# Patient Record
Sex: Male | Born: 1960 | Race: White | Hispanic: No | Marital: Married | Smoking: Current every day smoker
Health system: Southern US, Community
[De-identification: ages and names within clinical notes are randomized; demographics above are authoritative.]

## PROBLEM LIST (undated history)

## (undated) ENCOUNTER — Emergency Department (HOSPITAL_COMMUNITY): Discharge status: Absent without leave | Payer: Self-pay

---

## 2008-06-18 ENCOUNTER — Ambulatory Visit: Payer: Self-pay | Admitting: Diagnostic Radiology

## 2008-06-18 ENCOUNTER — Emergency Department (HOSPITAL_BASED_OUTPATIENT_CLINIC_OR_DEPARTMENT_OTHER): Admission: EM | Admit: 2008-06-18 | Discharge: 2008-06-18 | Payer: Self-pay | Admitting: Emergency Medicine

## 2010-03-16 ENCOUNTER — Ambulatory Visit: Payer: Self-pay | Admitting: Family Medicine

## 2011-02-24 ENCOUNTER — Encounter: Payer: Self-pay | Admitting: Family Medicine

## 2011-02-24 ENCOUNTER — Inpatient Hospital Stay (INDEPENDENT_AMBULATORY_CARE_PROVIDER_SITE_OTHER)
Admission: RE | Admit: 2011-02-24 | Discharge: 2011-02-24 | Disposition: A | Discharge status: Home / Self Care | Payer: MEDICARE | Source: Ambulatory Visit | Attending: Family Medicine | Admitting: Family Medicine

## 2011-02-24 DIAGNOSIS — J209 Acute bronchitis, unspecified: Secondary | ICD-10-CM

## 2011-02-24 DIAGNOSIS — J45909 Unspecified asthma, uncomplicated: Secondary | ICD-10-CM | POA: Insufficient documentation

## 2011-02-24 DIAGNOSIS — E785 Hyperlipidemia, unspecified: Secondary | ICD-10-CM | POA: Insufficient documentation

## 2011-02-26 ENCOUNTER — Encounter: Payer: Self-pay | Admitting: Family Medicine

## 2011-02-26 DIAGNOSIS — J209 Acute bronchitis, unspecified: Secondary | ICD-10-CM

## 2011-03-30 LAB — GLUCOSE, CAPILLARY: Glucose-Capillary: 160 mg/dL — ABNORMAL HIGH (ref 70–99)

## 2011-04-02 DIAGNOSIS — I10 Essential (primary) hypertension: Secondary | ICD-10-CM | POA: Insufficient documentation

## 2011-04-04 DIAGNOSIS — I728 Aneurysm of other specified arteries: Secondary | ICD-10-CM | POA: Insufficient documentation

## 2011-04-04 DIAGNOSIS — E785 Hyperlipidemia, unspecified: Secondary | ICD-10-CM | POA: Insufficient documentation

## 2011-04-04 DIAGNOSIS — F319 Bipolar disorder, unspecified: Secondary | ICD-10-CM | POA: Insufficient documentation

## 2011-05-28 NOTE — Progress Notes (Signed)
Summary: Additional lab testing  02/24/11 Tympanogram normal both ears Donna Christen MD  February 26, 2011 11:04 PM

## 2011-05-28 NOTE — Progress Notes (Signed)
Summary: ? SINUS INFX (room 4)   Vital Signs:  Patient Profile:   50 Years Old Male CC:      possible sinus infx x 1 week Height:     71 inches Weight:      192 pounds O2 Sat:      98 % O2 treatment:    Room Air Temp:     98.4 degrees F oral Pulse rate:   88 / minute Resp:     16 per minute BP sitting:   132 / 88  (left arm) Cuff size:   regular  Pt. in pain?   yes    Location:   sinus areas  Vitals Entered By: Lavell Islam RN (February 24, 2011 10:53 AM)                   Updated Prior Medication List: TRAMADOL HCL 50 MG TABS (TRAMADOL HCL) as needed * ZYRTEC HIVES RELIEF 2 MG TABS (CETIRIZINE HCL) daily ALBUTEROL SULFATE (2.5 MG/3ML) 0.083% NEBU (ALBUTEROL SULFATE) as needed * VITAMIN D daily CLONAZEPAM 0.5 MG TBDP (CLONAZEPAM) daily DEPAKOTE 500 MG TBEC (DIVALPROEX SODIUM) 3 at bedtime * FISH OIL daily GABAPENTIN 300 MG CAPS (GABAPENTIN) 2 x per day GEMFIBROZIL 600 MG TABS (GEMFIBROZIL) 2 x per day * LUTEIN VITAMIN 2x per day CRESTOR 10 MG TABS (ROSUVASTATIN CALCIUM) daily VENLAFAXINE HCL 75 MG TABS (VENLAFAXINE HCL) 3 x per day LISINOPRIL 10 MG TABS (LISINOPRIL) daily * TAMSULIN 0.4MG  2 x per day  Current Allergies: ! * NUMEROUS ENVIRONMENTAL ! * ALL CYLAN DRUGS AND COUSINS (NISTATIN) ! * ALL MYCIN DRUGS ! SULFA ! * RITALIN ! * CLARITON ! NSAIDS ! * VICATIN ! * LITHIUMHistory of Present Illness Chief Complaint: possible sinus infx x 1 week History of Present Illness:  Subjective: Patient complains of increased sinus congestion, cough and wheezing for about a week.  He continues to smoke. No sore throat No pleuritic pain + post-nasal drainage ? sinus pain/pressure No itchy/red eyes No earache No hemoptysis No SOB No fever/chills No nausea No vomiting No abdominal pain No diarrhea No skin rashes + fatigue No myalgias No headache    REVIEW OF SYSTEMS Constitutional Symptoms      Denies fever, chills, night sweats, weight loss, weight  gain, and fatigue.  Eyes       Denies change in vision, eye pain, eye discharge, glasses, contact lenses, and eye surgery. Ear/Nose/Throat/Mouth       Complains of change in hearing, frequent runny nose, sinus problems, and hoarseness.      Denies hearing loss/aids, ear pain, ear discharge, dizziness, frequent nose bleeds, sore throat, and tooth pain or bleeding.  Respiratory       Complains of dry cough.      Denies productive cough, wheezing, shortness of breath, asthma, bronchitis, and emphysema/COPD.  Cardiovascular       Denies murmurs, chest pain, and tires easily with exhertion.    Gastrointestinal       Denies stomach pain, nausea/vomiting, diarrhea, constipation, blood in bowel movements, and indigestion. Genitourniary       Denies painful urination, kidney stones, and loss of urinary control. Neurological       Complains of headaches.      Denies paralysis, seizures, and fainting/blackouts. Musculoskeletal       Complains of joint pain.      Denies muscle pain, joint stiffness, decreased range of motion, redness, swelling, muscle weakness, and gout.      Comments:  left shoulder Skin       Denies bruising, unusual mles/lumps or sores, and hair/skin or nail changes.  Psych       Denies mood changes, temper/anger issues, anxiety/stress, speech problems, depression, and sleep problems. Other Comments: Possible sinus infx x 1 week   Past History:  Past Medical History: kidney problems Bi-Polar seasonal allergies; environmenta Asthma Hyperlipidemia  Past Surgical History: 4 knee scopics 3 hernia repairs  Family History: unremarkable  Social History: Current Smoker Alcohol use-no Drug use-no Smoking Status:  current Drug Use:  no   Objective:  No acute distress  Eyes:  Pupils are equal, round, and reactive to light and accomodation.  Extraocular movement is intact.  Conjunctivae are not inflamed.  Ears:  Canals normal.  Tympanic membranes normal.   Nose:   Mildly congested turbinates.  No sinus tenderness  Pharynx:  Normal  Neck:  Supple.  No adenopathy is present.   Lungs:  Few scattered faint wheezies.  Breath sounds are equal. Heart:  Regular rate and rhythm without murmurs, rubs, or gallops.  Abdomen:  Nontender without masses or hepatosplenomegaly.  Bowel sounds are present.  No CVA or flank tenderness.  Extremities:  No edema.    Assessment New Problems: BRONCHITIS, ACUTE WITH MILD BRONCHOSPASM (ICD-466.0) HYPERLIPIDEMIA (ICD-272.4) ASTHMA (ICD-493.90)  SUSPECT COPD;  ? BRONCHITIS  Plan New Medications/Changes: PREDNISONE 10 MG TABS (PREDNISONE) 2 PO BID for 2 days, then 1 BID for 2 days, then 1 daily for 2 days.  Take PC  #14 x 0, 02/24/2011, Donna Christen MD DOXYCYCLINE HYCLATE 100 MG CAPS (DOXYCYCLINE HYCLATE) One by mouth two times a day  #20 x 0, 02/24/2011, Donna Christen MD COMBIVENT 103-18 MCG/ACT AERO (IPRATROPIUM-ALBUTEROL) 1 or 2 puffs inhaled qid (max 12 puffs/day)  #1 inhaler x 1, 02/24/2011, Donna Christen MD  New Orders: Pulse Oximetry (single measurment) [94760] Est. Patient Level IV [96045] Planning Comments:   Begin doxycycline, tapering course of prednisone, expectorant/decongestant, cough suppressant at bedtime.  Begin trial of Combivent (stop albuterol inhaler).  Increase fluid intake Followup with PCP if not improving 7 to 10 days   The patient and/or caregiver has been counseled thoroughly with regard to medications prescribed including dosage, schedule, interactions, rationale for use, and possible side effects and they verbalize understanding.  Diagnoses and expected course of recovery discussed and will return if not improved as expected or if the condition worsens. Patient and/or caregiver verbalized understanding.  Prescriptions: PREDNISONE 10 MG TABS (PREDNISONE) 2 PO BID for 2 days, then 1 BID for 2 days, then 1 daily for 2 days.  Take PC  #14 x 0   Entered and Authorized by:   Donna Christen MD    Signed by:   Donna Christen MD on 02/24/2011   Method used:   Print then Give to Patient   RxID:   [id number] DOXYCYCLINE HYCLATE 100 MG CAPS (DOXYCYCLINE HYCLATE) One by mouth two times a day  #20 x 0   Entered and Authorized by:   Donna Christen MD   Signed by:   Donna Christen MD on 02/24/2011   Method used:   Print then Give to Patient   RxID:   [id number] COMBIVENT 103-18 MCG/ACT AERO (IPRATROPIUM-ALBUTEROL) 1 or 2 puffs inhaled qid (max 12 puffs/day)  #1 inhaler x 1   Entered and Authorized by:   Donna Christen MD   Signed by:   Donna Christen MD on 02/24/2011   Method used:   Print then Give to Patient  RxID:   [id number]   Orders Added: 1)  Pulse Oximetry (single measurment) [94760] 2)  Est. Patient Level IV [13086]

## 2011-07-09 DIAGNOSIS — N251 Nephrogenic diabetes insipidus: Secondary | ICD-10-CM | POA: Insufficient documentation

## 2011-07-09 DIAGNOSIS — Z720 Tobacco use: Secondary | ICD-10-CM | POA: Insufficient documentation

## 2011-07-09 DIAGNOSIS — G4730 Sleep apnea, unspecified: Secondary | ICD-10-CM | POA: Insufficient documentation

## 2011-10-18 DIAGNOSIS — J45909 Unspecified asthma, uncomplicated: Secondary | ICD-10-CM | POA: Insufficient documentation

## 2011-10-19 DIAGNOSIS — S069X9A Unspecified intracranial injury with loss of consciousness of unspecified duration, initial encounter: Secondary | ICD-10-CM | POA: Insufficient documentation

## 2016-12-31 DIAGNOSIS — I714 Abdominal aortic aneurysm, without rupture, unspecified: Secondary | ICD-10-CM | POA: Insufficient documentation

## 2017-08-15 DIAGNOSIS — Z01818 Encounter for other preprocedural examination: Secondary | ICD-10-CM | POA: Insufficient documentation

## 2017-09-20 DIAGNOSIS — I723 Aneurysm of iliac artery: Secondary | ICD-10-CM | POA: Insufficient documentation

## 2018-03-19 DIAGNOSIS — Z992 Dependence on renal dialysis: Secondary | ICD-10-CM | POA: Insufficient documentation

## 2019-11-12 ENCOUNTER — Encounter: Payer: Self-pay | Admitting: Family Medicine

## 2019-11-12 ENCOUNTER — Other Ambulatory Visit: Payer: Self-pay

## 2019-11-12 ENCOUNTER — Ambulatory Visit: Payer: Medicare HMO | Admitting: Family Medicine

## 2019-11-12 VITALS — BP 110/60 | HR 90 | Ht 71.0 in | Wt 188.0 lb

## 2019-11-12 DIAGNOSIS — M7918 Myalgia, other site: Secondary | ICD-10-CM
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
## 2019-11-12 NOTE — Patient Instructions (Addendum)
Thank you for coming in today. Plan for exercises.  Use the Voltaren gel.  Pt would help.  Let me know if you want a PT order.  Recheck in 1 month if not better.   Please perform the exercise program that we have prepared for you and gone over in detail on a daily basis.  In addition to the handout you were provided you can access your program through: [email or web address]   Your unique program code is: [email or web address]    Piriformis Syndrome Rehab Ask your health care provider which exercises are safe for you. Do exercises exactly as told by your health care provider and adjust them as directed. It is normal to feel mild stretching, pulling, tightness, or discomfort as you do these exercises. Stop right away if you feel sudden pain or your pain gets worse. Do not begin these exercises until told by your health care provider. Stretching and range-of-motion exercises These exercises warm up your muscles and joints and improve the movement and flexibility of your hip and pelvis. The exercises also help to relieve pain, numbness, and tingling. Hip rotation This is an exercise in which you lie on your back and stretch the muscles that rotate your hip (hip rotators) to stretch your buttocks. 1. Lie on your back on a firm surface. 2. Pull your left / right knee toward your same shoulder with your left / right hand until your knee is pointing toward the ceiling. Hold your left / right ankle with your other hand. 3. Keeping your knee steady, gently pull your left / right ankle toward your other shoulder until you feel a stretch in your buttocks. 4. Hold this position for __________ seconds. Repeat __________ times. Complete this exercise __________ times a day. Hip extensor This is an exercise in which you lie on your back and pull your knee to your chest. 1. Lie on your back on a firm surface. Both of your legs should be straight. 2. Pull your left / right knee to your chest. Hold your leg in this  position by holding onto the back of your thigh or the front of your knee. 3. Hold this position for __________ seconds. 4. Slowly return to the starting position. Repeat __________ times. Complete this exercise __________ times a day. Strengthening exercises These exercises build strength and endurance in your hip and thigh muscles. Endurance is the ability to use your muscles for a long time, even after they get tired. Straight leg raises, side-lying This exercise strengthens the muscles that rotate the leg at the hip and move it away from your body (hip abductors). 1. Lie on your side with your left / right leg in the top position. Lie so your head, shoulder, knee, and hip line up. Bend your bottom knee to help you balance. 2. Lift your top leg 4-6 inches (10-15 cm) while keeping your toes pointed straight ahead. 3. Hold this position for __________ seconds. 4. Slowly lower your leg to the starting position. 5. Let your muscles relax completely after each repetition. Repeat __________ times. Complete this exercise __________ times a day. Hip abduction and rotation This is sometimes called quadruped (on hands and knees) exercises. 1. Get on your hands and knees on a firm, lightly padded surface. Your hands should be directly below your shoulders, and your knees should be directly below your hips. 2. Lift your left / right knee out to the side. Keep your knee bent. Do not twist your body. 3. Hold this position for  __________ seconds. 4. Slowly lower your leg. Repeat __________ times. Complete this exercise __________ times a day. Straight leg raises, face-down This exercise stretches the muscles that move your hips away from the front of the pelvis (hip extensors). 1. Lie on your abdomen on a bed or a firm surface with a pillow under your hips. 2. Squeeze your buttocks muscles and lift your left / right leg about 4-6 inches (10-15 cm) off the bed. Do not let your back arch. 3. Hold this  position for __________ seconds. 4. Slowly lower your leg to the starting position. 5. Let your muscles relax completely after each repetition. Repeat __________ times. Complete this exercise __________ times a day. This information is not intended to replace advice given to you by your health care provider. Make sure you discuss any questions you have with your health care provider. Document Revised: 10/02/2018 Document Reviewed: 04/03/2018 Elsevier Patient Education  [employer].

## 2019-12-09 ENCOUNTER — Other Ambulatory Visit: Payer: Self-pay

## 2019-12-09 ENCOUNTER — Ambulatory Visit: Payer: Medicare HMO | Admitting: Family Medicine

## 2019-12-09 VITALS — BP 116/80 | HR 79 | Ht 71.0 in | Wt 195.6 lb

## 2019-12-09 DIAGNOSIS — M7918 Myalgia, other site: Secondary | ICD-10-CM | POA: Diagnosis not present

## 2019-12-09 NOTE — Progress Notes (Signed)
   I, Christoper Fabian, LAT, ATC, am serving as scribe for Dr. [name].  Albert Black is a 59 y.o. male who presents to [facility] today for f/u of L hip/glute pain.  He was last seen by Dr. Denyse Amass on 11/12/19 and was provided w/ a HEP focusing on hip aBd strengthening w/ clamshells and piriformis stretching.  Since his last visit, pt reports that his L hip/glute area has improved.  He has been doing his HEP.   Pertinent review of systems: No fevers or chills  Relevant historical information: Dialysis   Exam:  BP 116/80 (BP Location: Left Arm, Patient Position: Sitting, Cuff Size: Normal)   Pulse 79   Ht 5\' 11"  (1.803 m)   Wt 195 lb 9.6 oz (88.7 kg)   SpO2 96%   BMI 27.28 kg/m  General: Well Developed, well nourished, and in no acute distress.   MSK: Hips normal motion normal gait     Assessment and Plan: 59 y.o. male with left hip piriformis syndrome much improved with home exercise program.  Watchful waiting recheck as needed.     Discussed warning signs or symptoms. Please see discharge instructions. Patient expresses understanding.   The above documentation has been reviewed and is accurate and complete 41, M.D.    Total encounter time 20 minutes including charting time date of service.

## 2019-12-09 NOTE — Patient Instructions (Signed)
Thank you for coming in today.   Continue the home exercises.   Recheck as needed.    

## 2020-02-29 DIAGNOSIS — L720 Epidermal cyst: Secondary | ICD-10-CM | POA: Insufficient documentation

## 2020-05-10 ENCOUNTER — Other Ambulatory Visit: Payer: Self-pay

## 2020-05-10 ENCOUNTER — Emergency Department
Admission: RE | Admit: 2020-05-10 | Discharge: 2020-05-10 | Disposition: A | Discharge status: Home / Self Care | Payer: Medicare HMO | Source: Ambulatory Visit | Attending: Family Medicine | Admitting: Family Medicine

## 2020-05-10 VITALS — BP 115/84 | HR 91 | Temp 98.0°F | Resp 18

## 2020-05-10 DIAGNOSIS — R229 Localized swelling, mass and lump, unspecified: Secondary | ICD-10-CM | POA: Diagnosis not present

## 2020-05-10 NOTE — ED Provider Notes (Signed)
[facility]    CSN: [id number] Arrival date & time: 05/10/20  1051      History   Chief Complaint Chief Complaint  Patient presents with  . Facial Pain    bilateral jaw area    HPI Albert Black is a 59 y.o. male.   Patient states that he has had two "knots," one one each side of his face, for about 3 weeks.  The nodules are not painful  The history is provided by the patient.    History reviewed. No pertinent past medical history.  Patient Active Problem List   Diagnosis Date Noted  . HYPERLIPIDEMIA 02/24/2011  . ASTHMA 02/24/2011    History reviewed. No pertinent surgical history.     Home Medications    Prior to Admission medications   Medication Sig Start Date End Date Taking? Authorizing Provider  amLODipine (NORVASC) 10 MG tablet take 1 tablet by mouth once daily 11/26/16   [provider]  calcium carbonate (TUMS EX) 750 MG chewable tablet Chew by mouth. 11/03/19   [provider]  fluticasone (FLONASE) 50 MCG/ACT nasal spray Place into the nose. 08/27/17   [provider]  furosemide (LASIX) 40 MG tablet Take by mouth. 04/23/17   [provider]  metoprolol tartrate (LOPRESSOR) 50 MG tablet  10/09/19   [provider]  pantoprazole (PROTONIX) 40 MG tablet  08/22/19   [provider]  tamsulosin (FLOMAX) 0.4 MG CAPS capsule Take by mouth. 10/12/16   [provider]  zolpidem (AMBIEN) 5 MG tablet  10/11/19   [provider]    Family History History reviewed. No pertinent family history.  Social History Social History   Tobacco Use  . Smoking status: Current Every Day Smoker  . Smokeless tobacco: Never Used  Vaping Use  . Vaping Use: Never used  Substance Use Topics  . Alcohol use: Yes  . Drug use: Never     Allergies   Other, Lithium, Methylphenidate hcl, Nsaids, Prednisone, and Sulfonamide derivatives   Review of Systems Review of Systems  Constitutional:  Negative for activity change, appetite change, chills, diaphoresis, fatigue and fever.  HENT:       Nodules face  Eyes: Negative.   Respiratory: Negative.   Cardiovascular: Negative.   Gastrointestinal: Negative.   Genitourinary: Negative.   Musculoskeletal: Negative.   Skin: Negative for color change.  Neurological: Negative.   Hematological: Negative for adenopathy.     Physical Exam Triage Vital Signs ED Triage Vitals  Enc Vitals Group     BP 05/10/20 1106 115/84     Pulse Rate 05/10/20 1106 91     Resp 05/10/20 1106 18     Temp 05/10/20 1106 98 F (36.7 C)     Temp Source 05/10/20 1106 Oral     SpO2 05/10/20 1106 98 %     Weight --      Height --      Head Circumference --      Peak Flow --      Pain Score 05/10/20 1107 0     Pain Loc --      Pain Edu? --      Excl. in GC? --    No data found.  Updated Vital Signs BP 115/84 (BP Location: Left Arm)   Pulse 91   Temp 98 F (36.7 C) (Oral)   Resp 18   SpO2 98%   Visual Acuity Right Eye Distance:   Left Eye Distance:  Bilateral Distance:    Right Eye Near:   Left Eye Near:    Bilateral Near:     Physical Exam Vitals and nursing note reviewed.  Constitutional:      General: He is not in acute distress. HENT:     Head: Atraumatic.      Comments: Bilateral 2cm diameter subcutaneous nodules each side of face.  No fluctuance or tenderness to palpation. No erythema or warmth.  Nodules are firm and mobile.    Right Ear: Tympanic membrane, ear canal and external ear normal.     Left Ear: Tympanic membrane, ear canal and external ear normal.     Nose: Nose normal.     Mouth/Throat:     Pharynx: Oropharynx is clear.  Eyes:     Extraocular Movements: Extraocular movements intact.     Conjunctiva/sclera: Conjunctivae normal.     Pupils: Pupils are equal, round, and reactive to light.  Cardiovascular:     Rate and Rhythm: Normal rate and regular rhythm.     Heart sounds: Normal heart sounds.  Pulmonary:      Breath sounds: Normal breath sounds.  Abdominal:     Tenderness: There is no abdominal tenderness.  Musculoskeletal:     Cervical back: Neck supple.     Right lower leg: No edema.     Left lower leg: No edema.  Lymphadenopathy:     Cervical: No cervical adenopathy.  Skin:    General: Skin is warm and dry.     Findings: No erythema.  Neurological:     General: No focal deficit present.     Mental Status: He is alert.      UC Treatments / Results  Labs (all labs ordered are listed, but only abnormal results are displayed) Labs Reviewed - No data to display  EKG   Radiology No results found.  Procedures Procedures (including critical care time)  Medications Ordered in UC Medications - No data to display  Initial Impression / Assessment and Plan / UC Course  I have reviewed the triage vital signs and the nursing notes.  Pertinent labs & imaging results that were available during my care of the patient were reviewed by me and considered in my medical decision making (see chart for details).    ?lipomas. ?adenopathy.  Lesions do not appear to be infected. Recommend biopsy.  Followup with Family Doctor or ENT.   Final Clinical Impressions(s) / UC Diagnoses   Final diagnoses:  Subcutaneous nodules   Discharge Instructions   None    ED Prescriptions    None        Lattie Haw, MD 05/12/20 1527

## 2020-05-10 NOTE — ED Triage Notes (Signed)
Pt states he has had the knots, one on the left side and one on the right side for about 3 weeks. Pt is concerned because a previous similar knot on his face was septic. Pt is oax4 and ambulatory.

## 2020-07-13 ENCOUNTER — Other Ambulatory Visit: Payer: Self-pay

## 2020-07-13 ENCOUNTER — Ambulatory Visit (INDEPENDENT_AMBULATORY_CARE_PROVIDER_SITE_OTHER): Payer: Medicare HMO

## 2020-07-13 ENCOUNTER — Ambulatory Visit: Payer: Medicare HMO | Admitting: Family Medicine

## 2020-07-13 VITALS — BP 152/90 | HR 84 | Ht 71.0 in | Wt 176.4 lb

## 2020-07-13 DIAGNOSIS — M25521 Pain in right elbow: Secondary | ICD-10-CM

## 2020-07-13 DIAGNOSIS — M25531 Pain in right wrist: Secondary | ICD-10-CM

## 2020-07-13 DIAGNOSIS — S62101A Fracture of unspecified carpal bone, right wrist, initial encounter for closed fracture: Secondary | ICD-10-CM

## 2020-07-13 DIAGNOSIS — K4090 Unilateral inguinal hernia, without obstruction or gangrene, not specified as recurrent: Secondary | ICD-10-CM | POA: Insufficient documentation

## 2020-07-13 NOTE — Progress Notes (Signed)
X-ray wrist shows a tiny avulsion fracture at the dorsal aspect of the wrist.  This is that little fracture I showed you the clinic.  The radiologist cannot tell how old it is.  I suspect that is probably new because you are painful in this area.

## 2020-07-13 NOTE — Patient Instructions (Addendum)
Thank you for coming in today.  Please follow up in 4 weeks sooner if needed.

## 2020-07-13 NOTE — Progress Notes (Signed)
I, Philbert Riser, LAT, ATC acting as a scribe for [name], MD.  Albert Black is a 60 y.o. male who presents to [facility] at [facility] today for R wrist pain following a fall on the ice on 07/12/20. Pt was last seen by Dr. Denyse Amass on 12/09/19 for L hip piriformis syndrome. Pt reports he fell backwards and used hands to brace the fall. Pt locates pain to dorsal aspect of R wrist. Additionally he notes a little bit of pain at the lateral aspect of his right elbow. He receives dialysis in his left arm.  R wrist swelling: slight R hand numbness/tingling: no Aggravates: wrist flexion, using wrist to push up from sitting Rx tried: Tylenol   Pertinent review of systems: No fevers or chills  Relevant historical information: Dialysis, vasculopathy, TBI, sleep apnea.   Exam:  BP (!) 152/90 (BP Location: Right Arm, Patient Position: Sitting, Cuff Size: Normal)   Pulse 84   Ht 5\' 11"  (1.803 m)   Wt 176 lb 6.4 oz (80 kg)   SpO2 97%   BMI 24.60 kg/m  General: Well Developed, well nourished, and in no acute distress.   MSK: Right elbow normal-appearing Normal motion. Mildly tender palpation at lateral epicondyle and radial head. Normal elbow motion and strength.  Right wrist. Mildly swollen with effusion. Mildly tender palpation dorsal wrist. Decreased wrist motion. Pulses cap refill and sensation are intact distally. Hand is nontender.    Lab and Radiology Results   X-ray images right elbow and right wrist obtained today personally and independently interpreted  Right wrist: Tiny avulsion fracture of dorsal aspect right wrist carpal bone.  No large or fracture of the radius or ulna seen.  No scaphoid fracture seen.  Right elbow: No fracture or joint effusion visible.  Mild degenerative changes present.  Await formal radiology review    Assessment and Plan: 60 y.o. male with right wrist pain after fall on outstretched wrist.  Patient has a tiny  avulsion fracture at the dorsal aspect of the carpal bone in the wrist.  I do not see a larger fracture today.  We will treat with a conventional semirigid wrist brace.  Plan to immobilize the wrist for a few weeks.  Check back in 4 weeks or sooner if needed.  If patient does have a worse fracture seen on x-ray and I appreciate today patient return to clinic in a week for repeat x-ray and proper casting.  Of note patient does receive dialysis fortunately in his left arm.   PDMP not reviewed this encounter. Orders Placed This Encounter  Procedures  . DG Wrist Complete Right    Standing Status:   Future    Number of Occurrences:   1    Standing Expiration Date:   07/13/2021    Order Specific Question:   Reason for Exam (SYMPTOM  OR DIAGNOSIS REQUIRED)    Answer:   right wrist pain    Order Specific Question:   Preferred imaging location?    Answer:   07/15/2021  . DG ELBOW COMPLETE RIGHT (3+VIEW)    Standing Status:   Future    Number of Occurrences:   1    Standing Expiration Date:   07/13/2021    Order Specific Question:   Reason for Exam (SYMPTOM  OR DIAGNOSIS REQUIRED)    Answer:   Right elbow pain    Order Specific Question:   Preferred imaging location?    Answer:  [place]   No orders of the defined types were placed in this encounter.    Discussed warning signs or symptoms. Please see discharge instructions. Patient expresses understanding.   The above documentation has been reviewed and is accurate and complete Lynne Leader, M.D.

## 2020-07-13 NOTE — Progress Notes (Signed)
X-ray elbow looks normal to radiology

## 2020-08-09 NOTE — Progress Notes (Deleted)
   I, Christoper Fabian, LAT, ATC, am serving as scribe for Dr. [name].  Albert Black is a 60 y.o. male who presents to [facility] at [facility] today for f/u of R wrist pain that began after falling on the ice on 07/12/20.  He was last seen by Dr. Denyse Amass on 07/13/20 and was placed in a wrist brace after XR confirmed a small avulsion fracture at the triquetrum.  Since his last visit, pt reports   Diagnostic testing: R wrist and R elbow XR- 07/13/20  Pertinent review of systems: ***  Relevant historical information: ***   Exam:  There were no vitals taken for this visit. General: Well Developed, well nourished, and in no acute distress.   MSK: ***    Lab and Radiology Results No results found for this or any previous visit (from the past 72 hour(s)). No results found.     Assessment and Plan: 60 y.o. male with ***   PDMP not reviewed this encounter. No orders of the defined types were placed in this encounter.  No orders of the defined types were placed in this encounter.    Discussed warning signs or symptoms. Please see discharge instructions. Patient expresses understanding.   ***

## 2020-08-10 ENCOUNTER — Ambulatory Visit: Payer: Medicare HMO | Admitting: Family Medicine

## 2020-10-31 ENCOUNTER — Ambulatory Visit: Payer: Medicare HMO | Admitting: Family Medicine

## 2020-10-31 ENCOUNTER — Ambulatory Visit (INDEPENDENT_AMBULATORY_CARE_PROVIDER_SITE_OTHER): Payer: Medicare HMO

## 2020-10-31 ENCOUNTER — Telehealth: Payer: Self-pay | Admitting: Family Medicine

## 2020-10-31 ENCOUNTER — Other Ambulatory Visit: Payer: Self-pay

## 2020-10-31 VITALS — BP 118/76 | HR 80 | Ht 71.0 in | Wt 189.0 lb

## 2020-10-31 DIAGNOSIS — M25522 Pain in left elbow: Secondary | ICD-10-CM | POA: Diagnosis not present

## 2020-10-31 DIAGNOSIS — M25552 Pain in left hip: Secondary | ICD-10-CM

## 2020-10-31 DIAGNOSIS — G8929 Other chronic pain: Secondary | ICD-10-CM

## 2020-10-31 NOTE — Patient Instructions (Signed)
Thank you for coming in today.  I've referred you to Physical Therapy.  Let us know if you don't hear from them in one week.  Recheck with me in 4  Weeks.   Let me know if this is not working.   I can do more including injections.   Xray report should come back in 1-2 days.

## 2020-10-31 NOTE — Telephone Encounter (Signed)
Pt stated we ordered PT for him. He called [employer] today and discovered that he would have to pay a copay at every PT visit but could get PT via Home Health for free.  Pt would like [place] to order this, based on his disability.

## 2020-10-31 NOTE — Progress Notes (Signed)
I, Albert Black, LAT, ATC acting as a scribe for Albert Graham, MD.  Albert Black is a 60 y.o. male who presents to [facility] at [facility] today for L hip and L elbow pain. MOI: Pt suffered a fall on Easter, 10/09/20, his R leg went numb when driving his car and he fell when trying to stand up landing on L side. Pt was last seen by Dr. Denyse Amass on 07/13/20 R wrist pain. Today, pt locates L hip pain to the lateral aspect of hip and buttock. Pt locates L elbow pain to the lateral aspect of elbow. Pt c/o increased elbow pain w/ wrist extension and elbow flexion.   Aggravates: up/down steps Treatments tried: rest, Tylenol  Pertinent review of systems: No fevers or chills  Relevant historical information: End-stage renal disease with dialysis   Exam:  BP 118/76 (BP Location: Right Arm, Patient Position: Sitting, Cuff Size: Normal)   Pulse 80   Ht 5\' 11"  (1.803 m)   Wt 189 lb (85.7 kg)   SpO2 96%   BMI 26.36 kg/m  General: Well Developed, well nourished, and in no acute distress.   MSK: Left elbow normal-appearing Mildly tender to palpation lateral epicondyle. Normal elbow and wrist motion.  Normal strength.  Left hip normal-appearing Normal motion. Tender palpation greater trochanter. Mild antalgic gait.    Lab and Radiology Results  X-ray images left elbow and left hip obtained today personally and independently interpreted.  Left elbow: No acute fractures.  No elbow effusion.  Left hip: No fractures.  Minimal degenerative changes.  Await formal radiology review   Assessment and Plan: 60 y.o. male with left elbow and hip pain after fall.  Fall occurred a few weeks ago.    Hip pain due mostly to contusion and hip abductor tendinopathy.  He is a great candidate for physical therapy.  Avoid NSAIDs.  Limited Tylenol.  Recheck in 1 month.  Left elbow pain: Again mostly due to contusion.  He is tender palpation of the epicondyles of physical therapy again  may be helpful.  Check 4 weeks.   PDMP not reviewed this encounter. Orders Placed This Encounter  Procedures  . DG ELBOW COMPLETE LEFT (3+VIEW)    Standing Status:   Future    Number of Occurrences:   1    Standing Expiration Date:   10/31/2021    Order Specific Question:   Reason for Exam (SYMPTOM  OR DIAGNOSIS REQUIRED)    Answer:   left elbow pain    Order Specific Question:   Preferred imaging location?    Answer:   12/31/2021  . DG HIP UNILAT W OR W/O PELVIS 2-3 VIEWS LEFT    Standing Status:   Future    Number of Occurrences:   1    Standing Expiration Date:   10/31/2021    Order Specific Question:   Reason for Exam (SYMPTOM  OR DIAGNOSIS REQUIRED)    Answer:   left hip pain    Order Specific Question:   Preferred imaging location?    Answer:   12/31/2021  . Ambulatory referral to Physical Therapy    Referral Priority:   Routine    Referral Type:   Physical Medicine    Referral Reason:   Specialty Services Required    Requested Specialty:   Physical Therapy   No orders of the defined types were placed in this encounter.    Discussed warning signs or symptoms. Please  see discharge instructions. Patient expresses understanding.   The above documentation has been reviewed and is accurate and complete Albert Black, M.D.

## 2020-11-01 NOTE — Telephone Encounter (Signed)
Home health physical therapy ordered.  You may not be eligible because you can drive but I will try and order it and see if we can get it authorized for you.

## 2020-11-01 NOTE — Telephone Encounter (Signed)
Called pt and left DM on VM w/ Dr. Zollie Pee message regarding ordering home health PT but being unsure if he will qualify due to his ability to drive.

## 2020-11-02 NOTE — Progress Notes (Signed)
Left hip x-ray does not show any acute bony problems.  No fractures no significant arthritis.

## 2020-11-02 NOTE — Progress Notes (Signed)
Left elbow x-ray looks normal to radiology.

## 2020-11-28 ENCOUNTER — Ambulatory Visit: Payer: Medicare HMO | Admitting: Family Medicine

## 2021-05-27 IMAGING — DX DG ELBOW COMPLETE 3+V*R*
4 series · 4 of 4 positions shown · non-contrast
Comparison: None

CLINICAL DATA: Medial RIGHT wrist pain and lateral epicondylar pain
of RIGHT elbow after falling yesterday

EXAM:
RIGHT ELBOW - COMPLETE 3+ VIEW

[elbow ap]
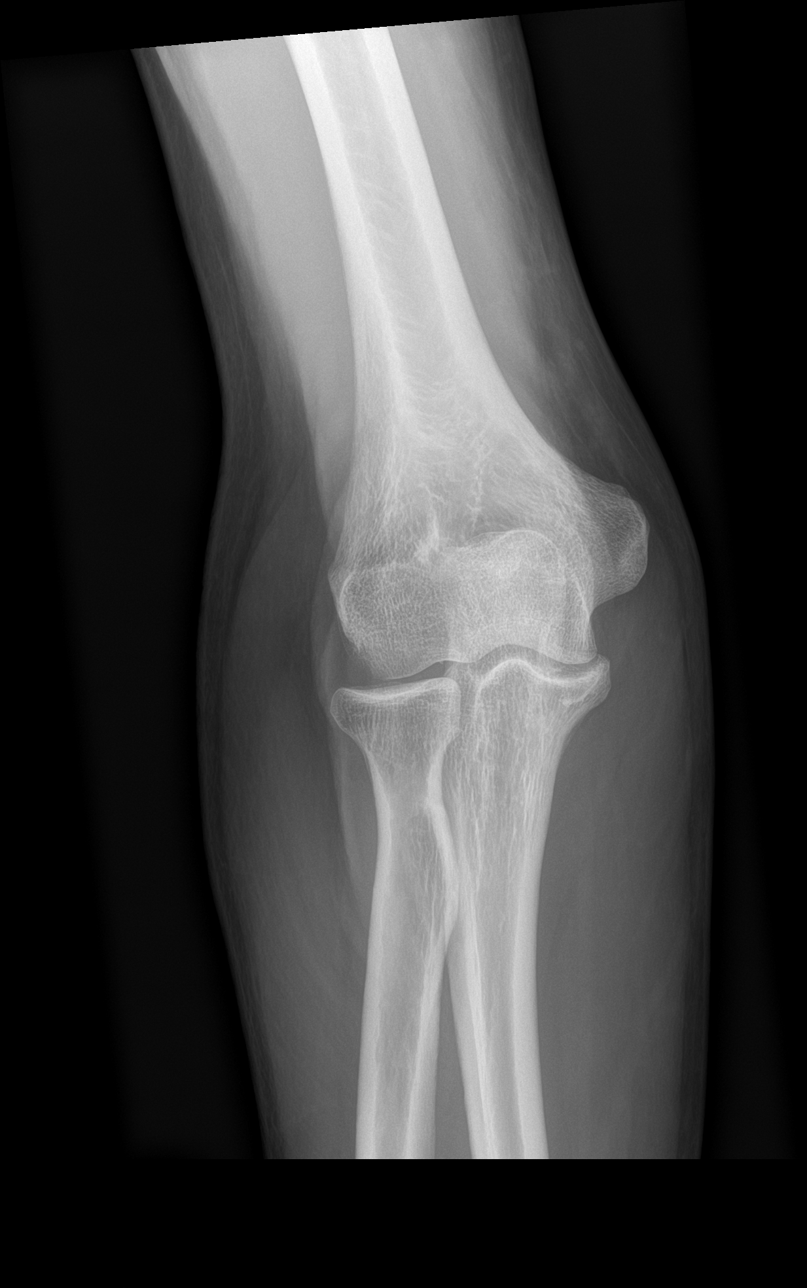

[elbow obl (1 of 2)]
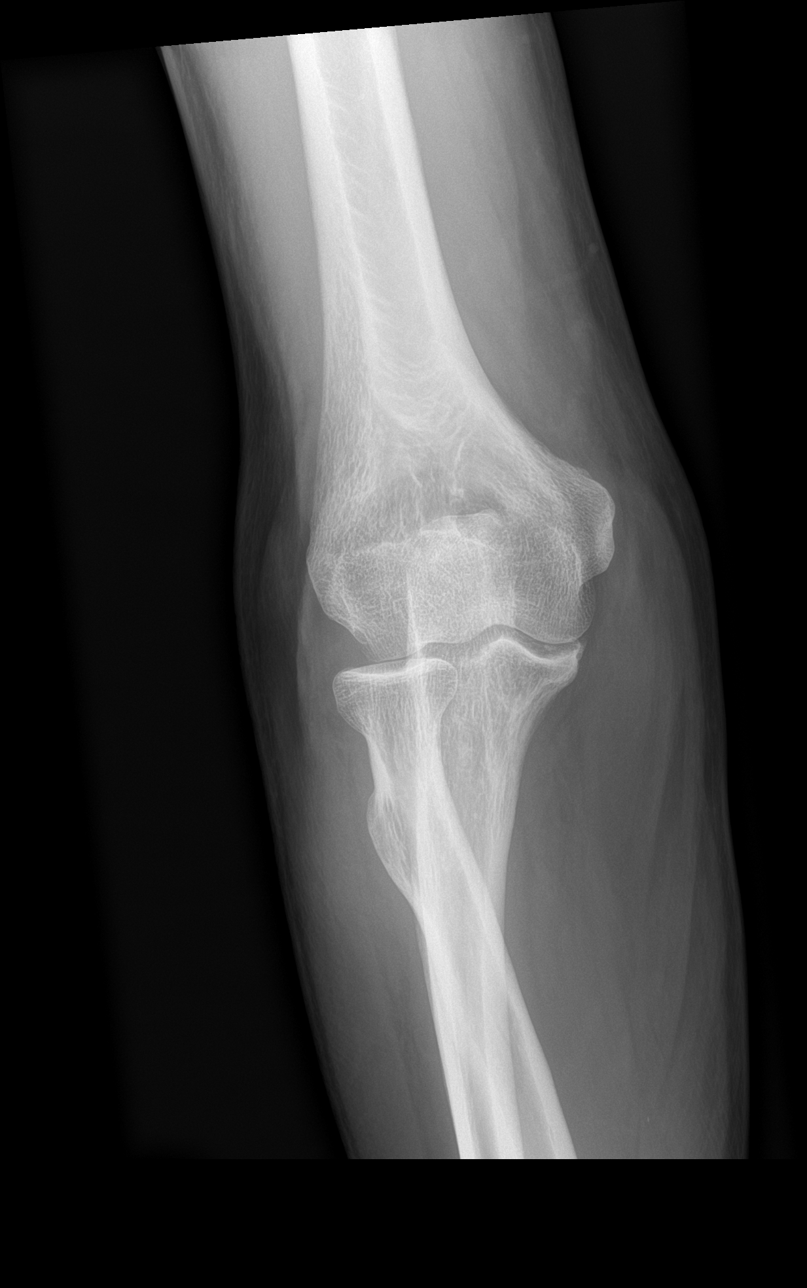

[elbow obl (2 of 2)]
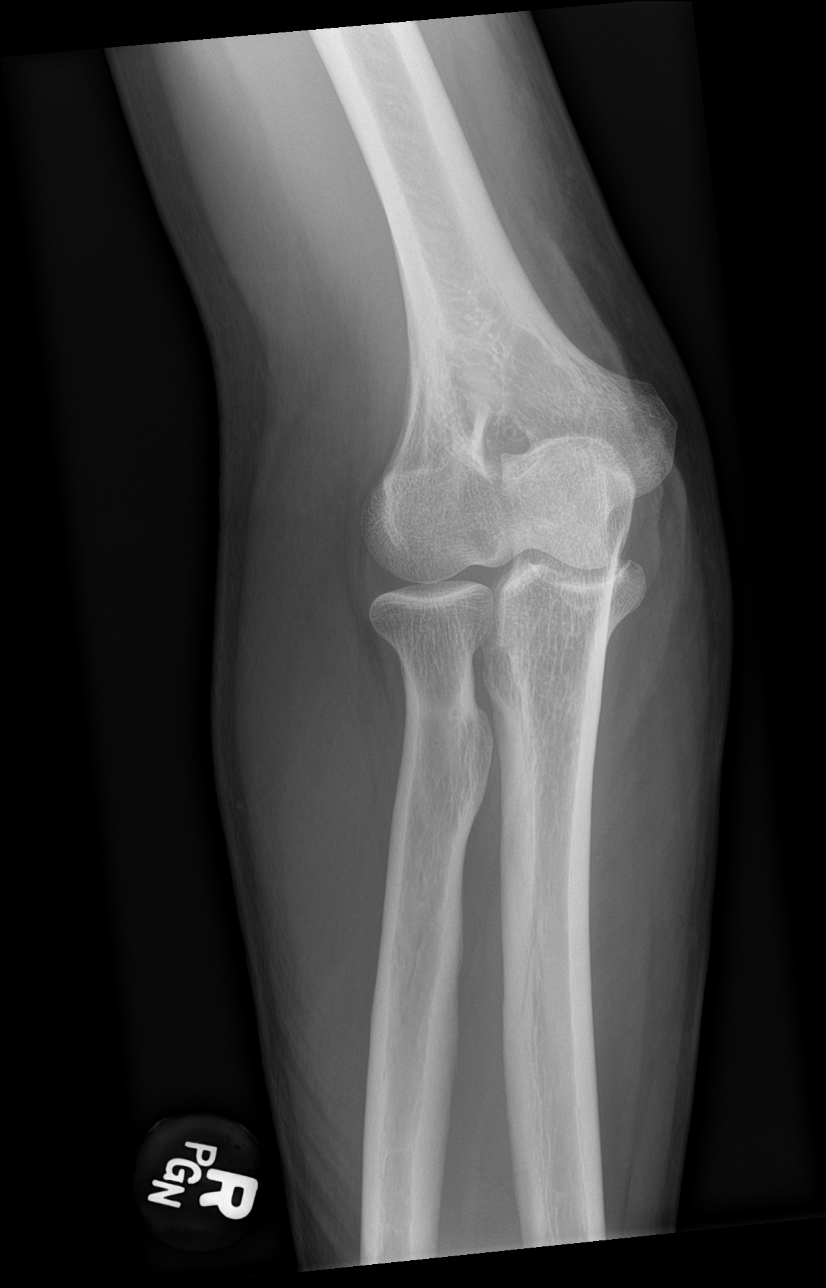

[elbow lat]
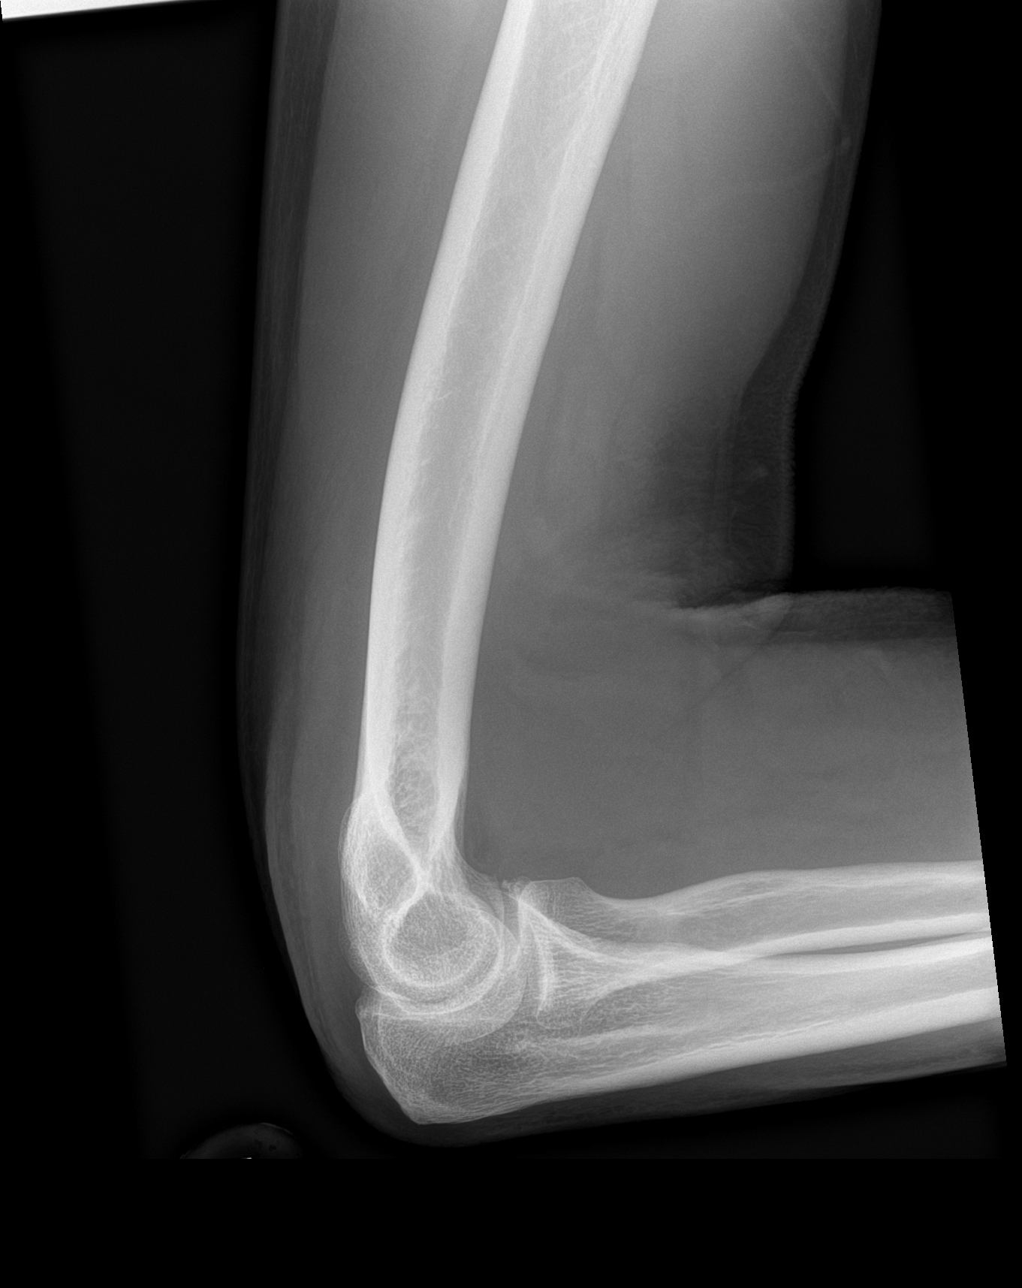

[4 of 4 positions shown; findings below may reference images not displayed]

FINDINGS: Osseous mineralization normal.

Joint spaces preserved.

No fracture, dislocation, or bone destruction.

No joint effusion.
IMPRESSION: Normal exam.

## 2021-09-14 IMAGING — DX DG HIP (WITH OR WITHOUT PELVIS) 2-3V*L*
3 series · 3 of 3 positions shown · non-contrast
Comparison: None.

CLINICAL DATA: Fell 10/09/2020, left hip pain

EXAM:
DG HIP (WITH OR WITHOUT PELVIS) 2-3V LEFT

[pelvis ap]
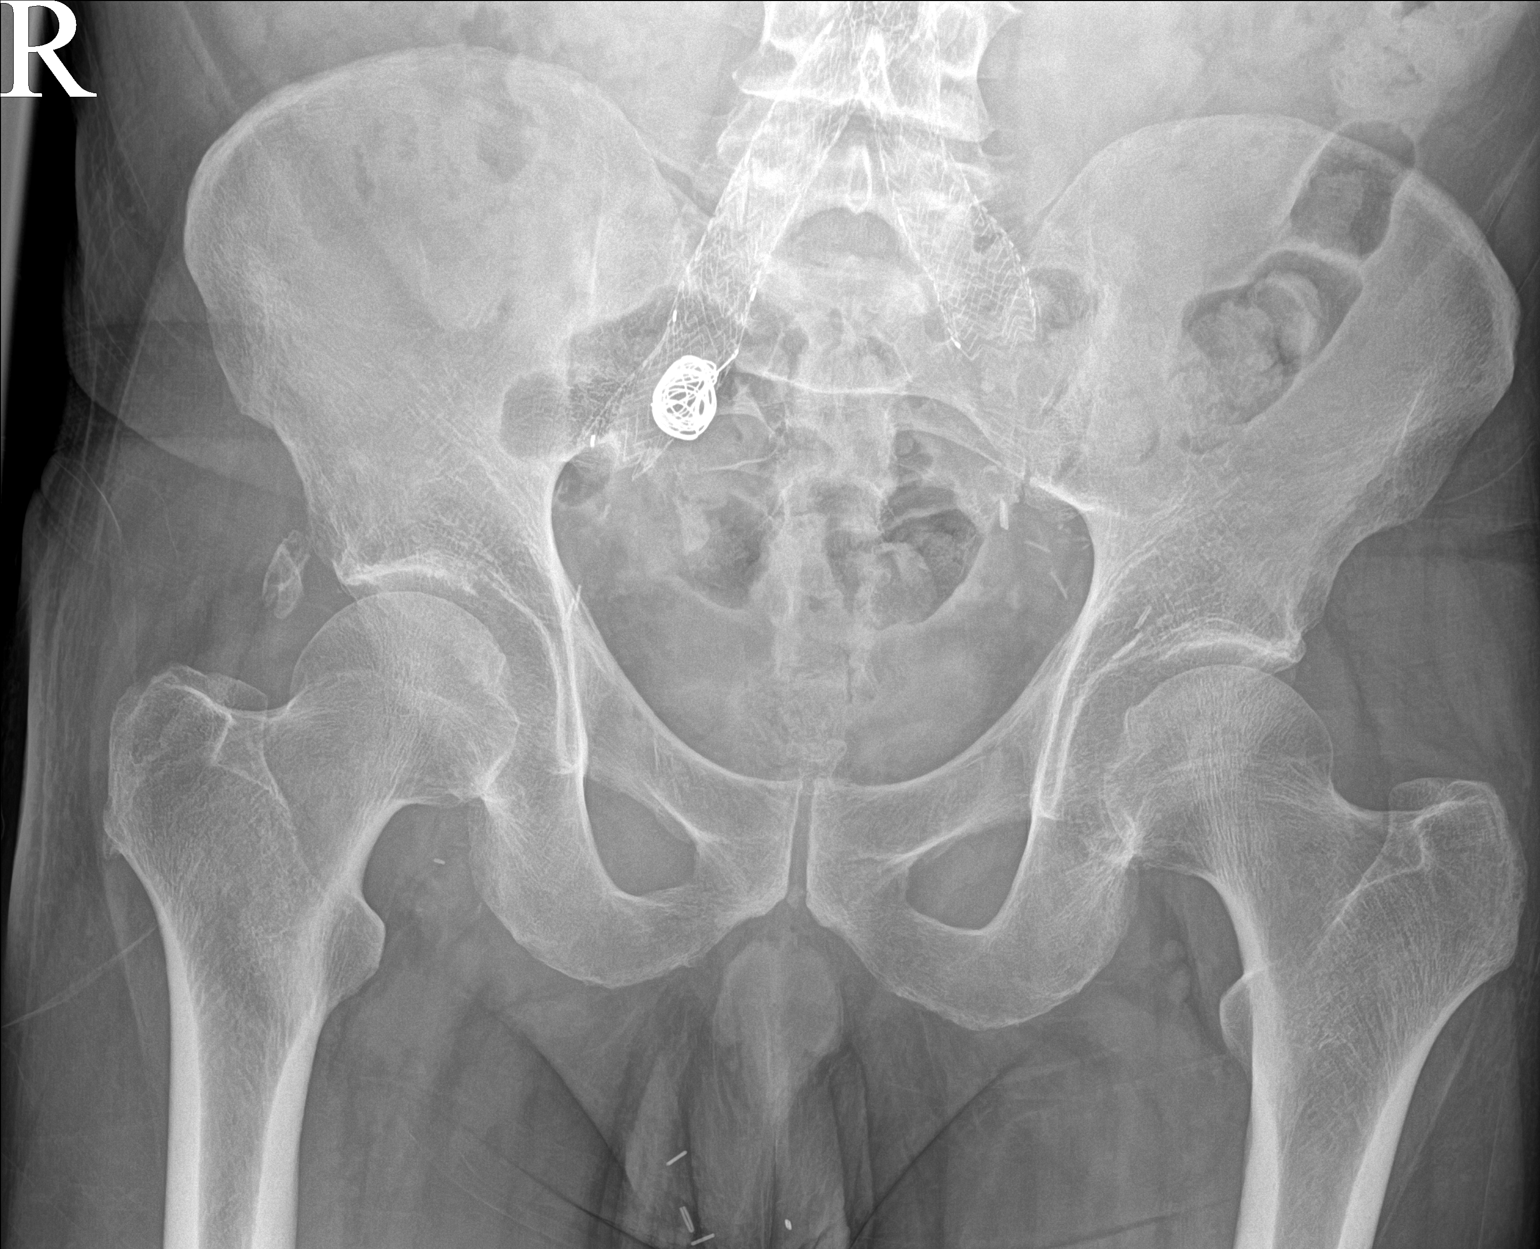

[hip ap]
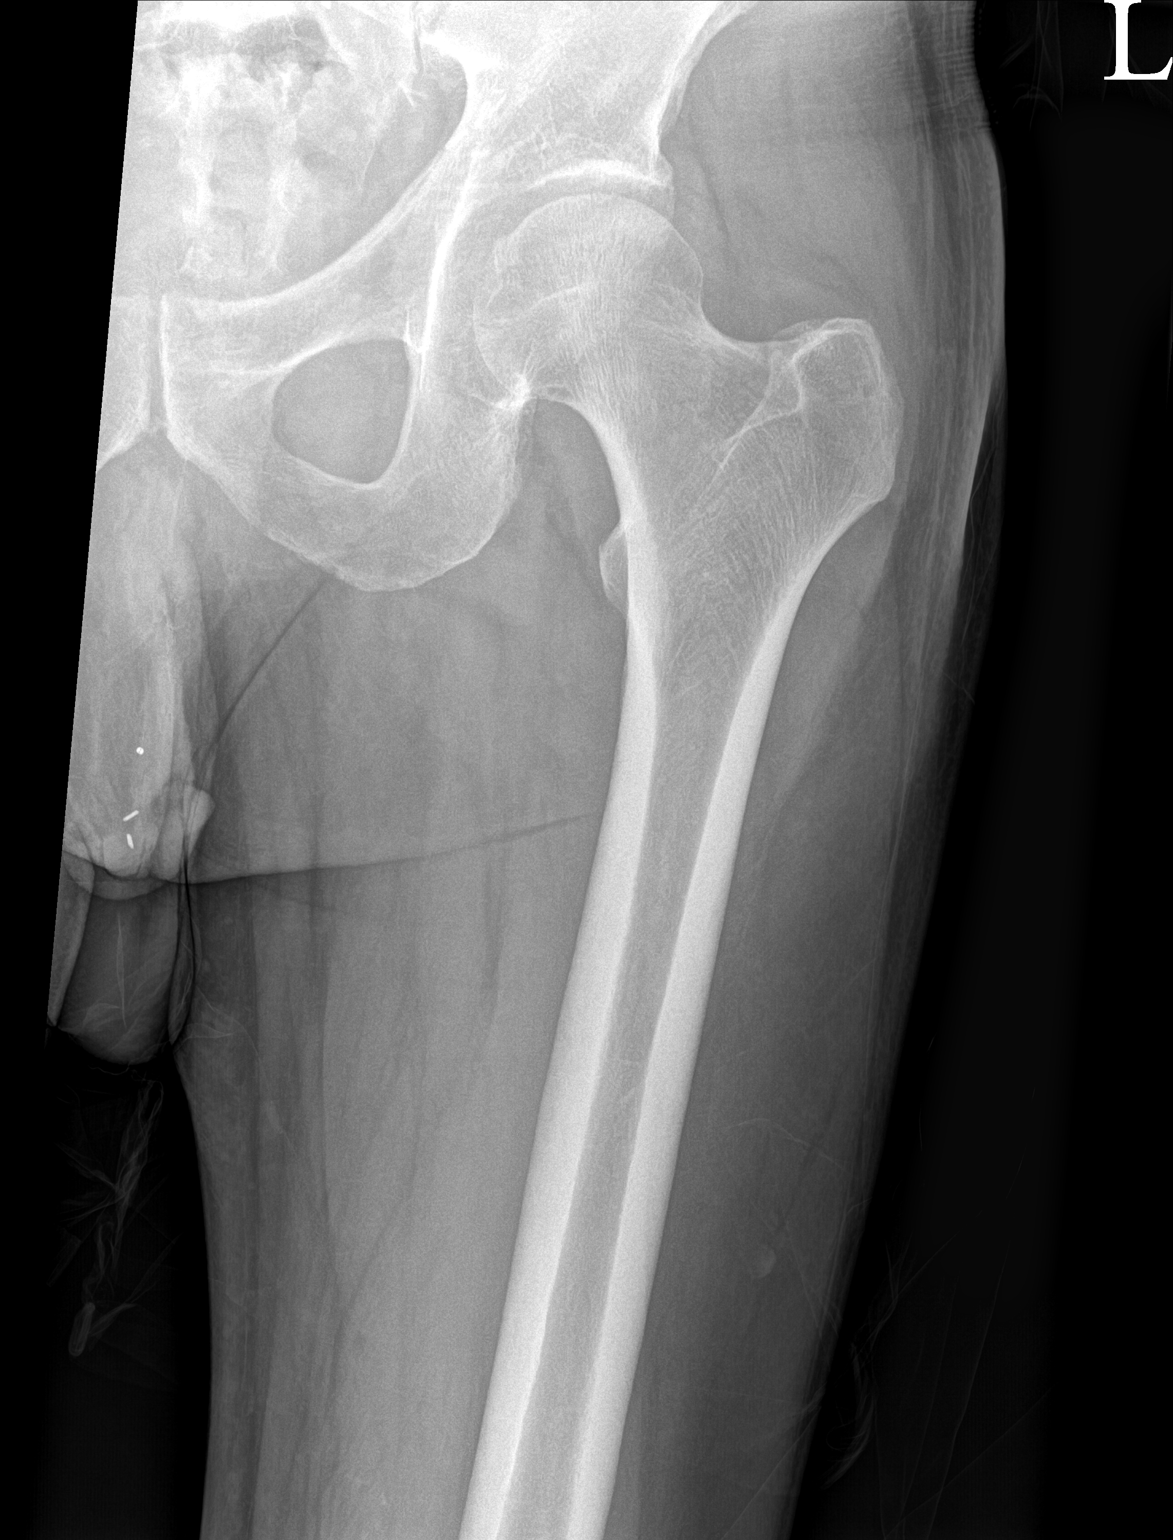

[hip frog leg]
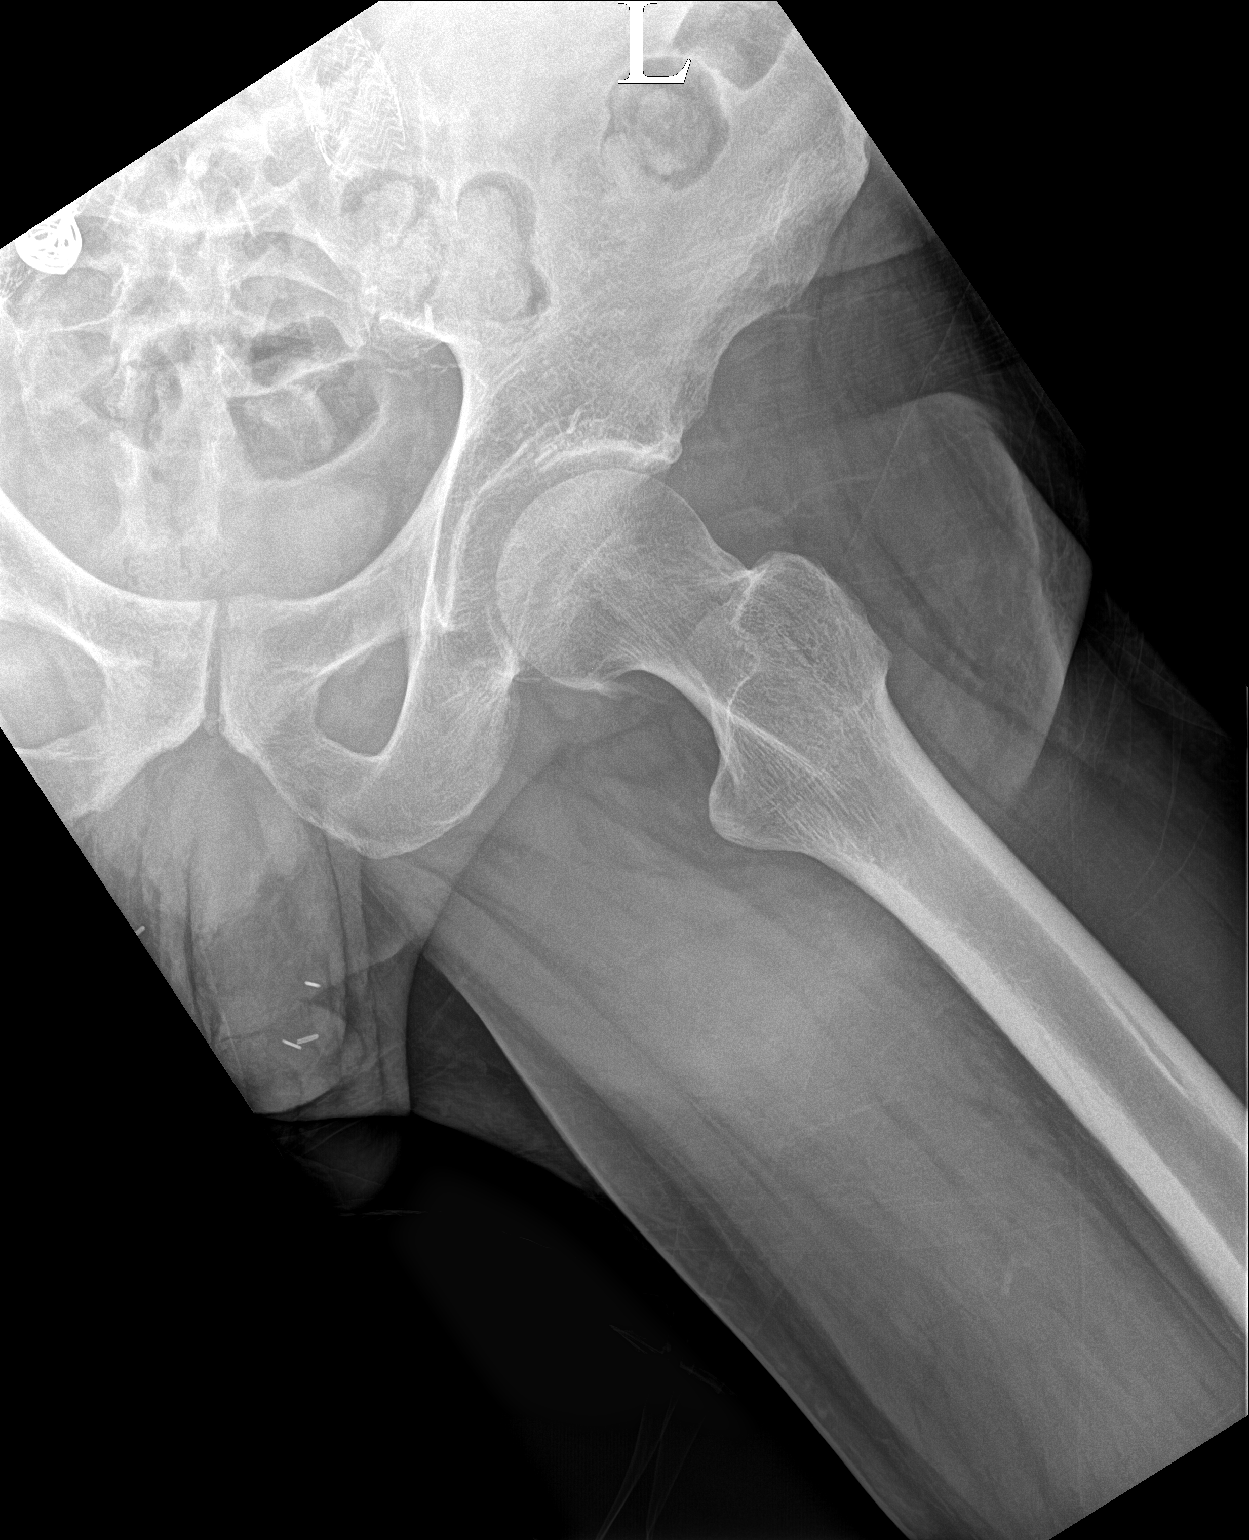

[3 of 3 positions shown; findings below may reference images not displayed]

FINDINGS: Frontal view of the pelvis as well as frontal and frogleg lateral
views of the left hip are obtained. No acute fracture, subluxation,
or dislocation. Joint spaces are well preserved. Bilateral iliac
stents are noted. Embolization coil overlies the right hemipelvis.
Sacroiliac joints are normal.
IMPRESSION: 1. No acute bony abnormality.
